# Patient Record
Sex: Male | Born: 2000
Health system: Southern US, Community
[De-identification: ages and names within clinical notes are randomized; demographics above are authoritative.]

## PROBLEM LIST (undated history)

## (undated) DIAGNOSIS — L309 Dermatitis, unspecified: Secondary | ICD-10-CM

---
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
## 2023-03-13 ENCOUNTER — Ambulatory Visit (INDEPENDENT_AMBULATORY_CARE_PROVIDER_SITE_OTHER): Payer: 59

## 2023-03-13 ENCOUNTER — Ambulatory Visit
Admission: RE | Admit: 2023-03-13 | Discharge: 2023-03-13 | Disposition: A | Discharge status: Home / Self Care | Payer: 59 | Source: Ambulatory Visit | Attending: Urgent Care | Admitting: Urgent Care

## 2023-03-13 VITALS — BP 112/75 | HR 75 | Temp 98.7°F | Resp 16

## 2023-03-13 DIAGNOSIS — R194 Change in bowel habit: Secondary | ICD-10-CM | POA: Diagnosis not present

## 2023-03-13 DIAGNOSIS — R197 Diarrhea, unspecified: Secondary | ICD-10-CM

## 2023-03-13 DIAGNOSIS — R198 Other specified symptoms and signs involving the digestive system and abdomen: Secondary | ICD-10-CM

## 2023-03-13 MED ORDER — DICYCLOMINE HCL 20 MG PO TABS: 20.0000 mg | ORAL_TABLET | Freq: Three times a day (TID) | ORAL | 0 refills | Status: AC

## 2023-03-13 NOTE — ED Provider Notes (Signed)
[facility]    CSN: [id number] Arrival date & time: 03/13/23  1731      History   Chief Complaint Chief Complaint  Patient presents with   Abdominal Pain    I have bad abdominal pain.  when I go bathroom it's mostly just mucous. I also have blood. - Entered by patient    HPI Nathaniel Ward is a 22 y.o. male.   22 year old male presents today due to concerns of changes in his bowel movements.  He states over the past 1 month he has been having significant changes from his baseline toileting habits.  At onset of symptoms, he states he was rather constipated.  At that point he was taking Colace to help with his constipation.  Over the past several weeks however he states he has been having diarrhea.  He reports the diarrhea is mixed with mucus, and sometimes he will have mucus without actual stool.  He denies any abdominal pain, but states it just feels "abnormal".  He still has a normal appetite.  Denies any nausea or vomiting.  He reports intermittent distention, but denies any increased flatulence or eructation. He reports the feeling of tenesmus. He had one episode of blood on the toilet paper when wiping this morning. States his anal region feels "raw."    Abdominal Pain   Past Medical History:  Diagnosis Date   Eczema     There are no problems to display for this patient.   History reviewed. No pertinent surgical history.     Home Medications    Prior to Admission medications   Medication Sig Start Date End Date Taking? Authorizing Provider  dicyclomine (BENTYL) 20 MG tablet Take 1 tablet (20 mg total) by mouth 3 (three) times daily before meals. 03/13/23  Yes Evelin Cake L, PA  acetaminophen (TYLENOL) 500 MG tablet Take 500 mg by mouth every 6 (six) hours as needed.    [provider]  albuterol (PROVENTIL HFA;VENTOLIN HFA) 108 (90 BASE) MCG/ACT inhaler Inhale 2 puffs into the lungs every 4 (four) hours as needed for wheezing (cough, shortness of  breath or wheezing.). 12/12/12   Floydene Flock, MD  diphenhydrAMINE (BENADRYL) 25 MG tablet Take 25 mg by mouth at bedtime as needed.    [provider]  hydrocortisone cream 1 % Apply topically 2 (two) times daily.    [provider]    Family History Family History  Problem Relation Age of Onset   Healthy Mother    Healthy Father     Social History Social History   Tobacco Use   Smoking status: Never   Smokeless tobacco: Never  Vaping Use   Vaping Use: Never used  Substance Use Topics   Alcohol use: Yes    Comment: every other weekend     Allergies   Patient has no known allergies.   Review of Systems Review of Systems  Gastrointestinal:  Positive for abdominal pain.  As per HPI   Physical Exam Triage Vital Signs ED Triage Vitals [03/13/23 1801]  Enc Vitals Group     BP 112/75     Pulse Rate 75     Resp 16     Temp 98.7 F (37.1 C)     Temp Source Oral     SpO2 96 %     Weight      Height      Head Circumference      Peak Flow      Pain  Score 0     Pain Loc      Pain Edu?      Excl. in GC?    No data found.  Updated Vital Signs BP 112/75 (BP Location: Left Arm)   Pulse 75   Temp 98.7 F (37.1 C) (Oral)   Resp 16   SpO2 96%   Visual Acuity Right Eye Distance:   Left Eye Distance:   Bilateral Distance:    Right Eye Near:   Left Eye Near:    Bilateral Near:     Physical Exam Vitals and nursing note reviewed. Exam conducted with a chaperone present.  Constitutional:      General: He is not in acute distress.    Appearance: He is well-developed and normal weight. He is not ill-appearing, toxic-appearing or diaphoretic.  HENT:     Head: Normocephalic and atraumatic.     Mouth/Throat:     Mouth: Mucous membranes are moist.     Pharynx: Oropharynx is clear. No pharyngeal swelling or oropharyngeal exudate.  Eyes:     General: No scleral icterus.    Extraocular Movements: Extraocular movements intact.     Pupils:  Pupils are equal, round, and reactive to light.  Cardiovascular:     Rate and Rhythm: Normal rate and regular rhythm.  Pulmonary:     Effort: Pulmonary effort is normal.     Breath sounds: Normal breath sounds.  Abdominal:     General: Abdomen is flat. Bowel sounds are normal. There is no distension.     Palpations: Abdomen is soft. There is no hepatomegaly or splenomegaly.     Tenderness: There is generalized abdominal tenderness. There is no right CVA tenderness, left CVA tenderness, guarding or rebound. Negative signs include Murphy's sign, Rovsing's sign, McBurney's sign, psoas sign and obturator sign.     Hernia: No hernia is present.  Skin:    General: Skin is warm and dry.     Coloration: Skin is not cyanotic, mottled or pale.     Findings: No rash.  Neurological:     General: No focal deficit present.     Mental Status: He is alert and oriented to person, place, and time.      UC Treatments / Results  Labs (all labs ordered are listed, but only abnormal results are displayed) Labs Reviewed  GASTROINTESTINAL PANEL BY PCR, STOOL (REPLACES STOOL CULTURE)  H. PYLORI ANTIGEN, STOOL  COMPREHENSIVE METABOLIC PANEL  CBC WITH DIFFERENTIAL/PLATELET  CEA    EKG   Radiology DG Abd 2 Views  Result Date: 03/13/2023 CLINICAL DATA:  change in bowel movements, tenesmus, constipation/ diarrhea; EXAM: ABDOMEN - 2 VIEW COMPARISON:  None Available. FINDINGS: Upright and supine views of the abdomen obtained. No free intra-abdominal air. No bowel dilatation or evidence of obstruction. There is a moderate volume of stool in the ascending, distal transverse and descending colon. Small volume of stool in the sigmoid colon. No abnormal rectal distention. No bowel air-fluid levels. No radiopaque foreign body or soft tissue calcifications. No concerning intraabdominal mass effect. No osseous abnormalities. The lung bases are clear. Mild overlying artifact from clothing. IMPRESSION: 1. Normal bowel  gas pattern. 2. Moderate colonic stool burden. No obstruction or fecal impaction. Electronically Signed   By: Narda Rutherford M.D.   On: 03/13/2023 18:57    Procedures Procedures (including critical care time)  Medications Ordered in UC Medications - No data to display  Initial Impression / Assessment and Plan / UC Course  I have  reviewed the triage vital signs and the nursing notes.  Pertinent labs & imaging results that were available during my care of the patient were reviewed by me and considered in my medical decision making (see chart for details).     Diarrhea -patient does have a moderate amount of retained stool as noted on his x-ray, but is complaining primarily of diarrhea.  I question if this is more of an IBS-like picture.  He is not actually having abdominal pain, I do not suspect this to be a colitis.  We will do a trial of dicyclomine to help with his current symptoms.  I also will test his stool for O&P, culture and sensitivity, and H. pylori antigen. Tenesmus -given patient's current symptoms, I feel that he warrants further evaluation with gastroenterology.  Pt to call GI tomorrow to schedule a further workup Recent change in BMs -CMP, CBC, CEA obtained today.  Patient has a family history of colon cancer apparently.  Patient's vital signs are stable, overall exam today benign.  Workup as above.   Final Clinical Impressions(s) / UC Diagnoses   Final diagnoses:  Diarrhea, unspecified type  Tenesmus (rectal)  Recent change in frequency of bowel movements     Discharge Instructions      Your x-ray does show a moderate amount of stool however no impaction. Please complete the stool sample at your earliest convenience and bring back to our office to send out. We drew labs to further assess the cause of your symptoms. Please start taking the medication called in today.  Is an antispasmodic for the GI tract. Please also call [facility], their phone number and address is listed on this form.  They will perform a more in-depth and further workup.  If you develop severe abdominal pain or continued bleeding, please head to the ER.     ED Prescriptions     Medication Sig Dispense Auth. Provider   dicyclomine (BENTYL) 20 MG tablet Take 1 tablet (20 mg total) by mouth 3 (three) times daily before meals. 20 tablet Srihaan Mastrangelo L, [place]      PDMP not reviewed this encounter.   Maretta Bees, [place] 03/13/23 1930

## 2023-03-13 NOTE — Discharge Instructions (Addendum)
Your x-ray does show a moderate amount of stool however no impaction. Please complete the stool sample at your earliest convenience and bring back to our office to send out. We drew labs to further assess the cause of your symptoms. Please start taking the medication called in today.  Is an antispasmodic for the GI tract. Please also call [facility], their phone number and address is listed on this form.  They will perform a more in-depth and further workup.  If you develop severe abdominal pain or continued bleeding, please head to the ER.

## 2023-03-13 NOTE — ED Triage Notes (Signed)
Nausea with eating with diarrhea following eating. This past Friday he noticed blood on the toilet paper, hasn't seen any since. States he does have generalized abdominal cramping. Concerned d/t a large family hx of colon cancer. States these symptoms have been on-going for a month. Does not have a primary care doctor and says he has not history of similar experiences. Denies any fevers, changes to urinary habits, recent course of antibiotics.

## 2023-03-13 NOTE — ED Notes (Signed)
Patient could not obtain sample while in office, provider notified

## 2023-03-16 LAB — CBC WITH DIFFERENTIAL/PLATELET
Basophils Absolute: 0 10*3/uL (ref 0.0–0.2)
Basos: 1 %
EOS (ABSOLUTE): 0.2 10*3/uL (ref 0.0–0.4)
Eos: 4 %
Hematocrit: 41 % (ref 37.5–51.0)
Hemoglobin: 14.2 g/dL (ref 13.0–17.7)
Immature Grans (Abs): 0 10*3/uL (ref 0.0–0.1)
Immature Granulocytes: 0 %
Lymphocytes Absolute: 1.8 10*3/uL (ref 0.7–3.1)
Lymphs: 28 %
MCH: 31.1 pg (ref 26.6–33.0)
MCHC: 34.6 g/dL (ref 31.5–35.7)
MCV: 90 fL (ref 79–97)
Monocytes Absolute: 0.6 10*3/uL (ref 0.1–0.9)
Monocytes: 9 %
Neutrophils Absolute: 3.6 10*3/uL (ref 1.4–7.0)
Neutrophils: 58 %
Platelets: 295 10*3/uL (ref 150–450)
RBC: 4.56 x10E6/uL (ref 4.14–5.80)
RDW: 11.8 % (ref 11.6–15.4)
WBC: 6.2 10*3/uL (ref 3.4–10.8)

## 2023-03-16 LAB — COMPREHENSIVE METABOLIC PANEL
ALT: 24 IU/L (ref 0–44)
AST: 21 IU/L (ref 0–40)
Albumin/Globulin Ratio: 2 (ref 1.2–2.2)
Albumin: 4.8 g/dL (ref 4.3–5.2)
Alkaline Phosphatase: 88 IU/L (ref 44–121)
BUN/Creatinine Ratio: 18 (ref 9–20)
BUN: 16 mg/dL (ref 6–20)
Bilirubin Total: <0.2 mg/dL (ref 0.0–1.2)
CO2: 25 mmol/L (ref 20–29)
Calcium: 9.8 mg/dL (ref 8.7–10.2)
Chloride: 101 mmol/L (ref 96–106)
Creatinine, Ser: 0.89 mg/dL (ref 0.76–1.27)
Globulin, Total: 2.4 g/dL (ref 1.5–4.5)
Glucose: 92 mg/dL (ref 70–99)
Potassium: 4.3 mmol/L (ref 3.5–5.2)
Sodium: 141 mmol/L (ref 134–144)
Total Protein: 7.2 g/dL (ref 6.0–8.5)
eGFR: 125 mL/min/{1.73_m2} (ref >59)

## 2023-03-16 LAB — CEA: CEA: 0.7 ng/mL (ref 0.0–4.7)

## 2024-10-26 ENCOUNTER — Ambulatory Visit
Admission: EM | Admit: 2024-10-26 | Discharge: 2024-10-26 | Disposition: A | Discharge status: Home / Self Care | Attending: Family Medicine | Admitting: Family Medicine

## 2024-10-26 ENCOUNTER — Ambulatory Visit

## 2024-10-26 ENCOUNTER — Encounter: Payer: Self-pay | Admitting: *Deleted

## 2024-10-26 DIAGNOSIS — M25572 Pain in left ankle and joints of left foot: Secondary | ICD-10-CM | POA: Diagnosis not present

## 2024-10-26 DIAGNOSIS — S93492S Sprain of other ligament of left ankle, sequela: Secondary | ICD-10-CM

## 2024-10-26 MED ORDER — CELECOXIB 200 MG PO CAPS: 200.0000 mg | ORAL_CAPSULE | Freq: Every day | ORAL | 0 refills | Status: AC

## 2024-10-26 NOTE — ED Provider Notes (Signed)
 " [name]    CSN: [id number] Arrival date & time: 10/26/24  1027      History   Chief Complaint Chief Complaint  Patient presents with   Ankle Pain    HPI Nathaniel Ward is a 24 y.o. male.   HPI pleasant 24 year old male presents with left ankle pain and swelling after doing 15 mile ultra run 3 weeks ago.  Patient reports took 1 way off and now is running 20 miles per week since.  Reports running 10 miles last night.  Patient is accompanied by his wife this morning.  Past Medical History:  Diagnosis Date   Eczema     There are no active problems to display for this patient.   History reviewed. No pertinent surgical history.     Home Medications    Prior to Admission medications  Medication Sig Start Date End Date Taking? Authorizing Provider  celecoxib  (CELEBREX ) 200 MG capsule Take 1 capsule (200 mg total) by mouth daily for 15 days. 10/26/24 11/10/24 Yes Teddy Sharper, FNP  dicyclomine  (BENTYL ) 20 MG tablet Take 1 tablet (20 mg total) by mouth 3 (three) times daily before meals. 03/13/23   Lowella Benton CROME, PA    Family History Family History  Problem Relation Age of Onset   Healthy Mother    Healthy Father     Social History Social History[1]   Allergies   Patient has no known allergies.   Review of Systems Review of Systems  Musculoskeletal:        Left ankle pain x 3 weeks secondary to endurance running.  All other systems reviewed and are negative.    Physical Exam Triage Vital Signs ED Triage Vitals  Encounter Vitals Group     BP 10/26/24 1109 117/73     Girls Systolic BP Percentile --      Girls Diastolic BP Percentile --      Boys Systolic BP Percentile --      Boys Diastolic BP Percentile --      Pulse Rate 10/26/24 1109 77     Resp 10/26/24 1109 16     Temp 10/26/24 1109 97.8 F (36.6 C)     Temp Source 10/26/24 1109 Oral     SpO2 10/26/24 1109 96 %     Weight 10/26/24 1107 169 lb 8 oz (76.9 kg)     Height --       Head Circumference --      Peak Flow --      Pain Score 10/26/24 1107 7     Pain Loc --      Pain Education --      Exclude from Growth Chart --    No data found.  Updated Vital Signs BP 117/73 (BP Location: Right Arm)   Pulse 77   Temp 97.8 F (36.6 C) (Oral)   Resp 16   Wt 169 lb 8 oz (76.9 kg)   SpO2 96%   BMI 26.55 kg/m    Physical Exam Vitals and nursing note reviewed.  Constitutional:      Appearance: Normal appearance. He is normal weight.  HENT:     Head: Normocephalic and atraumatic.     Mouth/Throat:     Mouth: Mucous membranes are moist.     Pharynx: Oropharynx is clear.  Eyes:     Extraocular Movements: Extraocular movements intact.     Conjunctiva/sclera: Conjunctivae normal.     Pupils: Pupils are equal, round, and reactive to light.  Cardiovascular:     Rate and Rhythm: Normal rate and regular rhythm.     Heart sounds: Normal heart sounds.  Pulmonary:     Effort: Pulmonary effort is normal.     Breath sounds: Normal breath sounds. No wheezing, rhonchi or rales.  Musculoskeletal:        General: Normal range of motion.  Skin:    General: Skin is warm and dry.  Neurological:     Mental Status: He is alert.      UC Treatments / Results  Labs (all labs ordered are listed, but only abnormal results are displayed) Labs Reviewed - No data to display  EKG   Radiology DG Ankle Complete Left Result Date: 10/26/2024 EXAM: 3 OR MORE VIEW(S) XRAY OF THE LEFT ANKLE 10/26/2024 11:29:05 AM CLINICAL HISTORY: Left ankle pain x 3 weeks COMPARISON: None available. FINDINGS: BONES AND JOINTS: No acute fracture. No malalignment. SOFT TISSUES: Unremarkable. IMPRESSION: 1. No significant abnormality. Electronically signed by: Evalene Coho MD 10/26/2024 11:50 AM EST RP Workstation: [id number]    Procedures Procedures (including critical care time)  Medications Ordered in UC Medications - No data to display  Initial Impression / Assessment and Plan / UC  Course  I have reviewed the triage vital signs and the nursing notes.  Pertinent labs & imaging results that were available during my care of the patient were reviewed by me and considered in my medical decision making (see chart for details).     MDM: 1.  Acute left ankle pain-DG left ankle complete results revealed above, patient advised.  Rx'd Celebrex  200 mg capsule: Take 1 capsule daily x 15 days; 2.  Sprain of other ligament of left ankle, sequela. Advised patient of left ankle x-ray results with hardcopy.  Advised patient may RICE affected area of left ankle for 30 minutes 3 times daily for the next 3 days.  Encouraged patient to avoid running for the next 7 to 10 days.  Advised patient may swim for increased cardiovascular endurance.  Advised to take medication as directed with food to completion.  Encouraged increase daily water intake to 64 ounces per day while taking this medication.  Advised if symptoms worsen and/or unresolved please follow-up with your PCP, [facility], or for further evaluation. Patient discharged home, hemodynamically stable. Final Clinical Impressions(s) / UC Diagnoses   Final diagnoses:  Acute left ankle pain  Sprain of other ligament of left ankle, sequela     Discharge Instructions      Advised patient of left ankle x-ray results with hardcopy.  Advised patient may RICE affected area of left ankle for 30 minutes 3 times daily for the next 3 days.  Encouraged patient to avoid running for the next 7 to 10 days.  Advised patient may swim for increased cardiovascular endurance.  Advised to take medication as directed with food to completion.  Encouraged increase daily water intake to 64 ounces per day while taking this medication.  Advised if symptoms worsen and/or unresolved please follow-up with your PCP, [facility], or for further evaluation.     ED Prescriptions     Medication Sig Dispense Auth. Provider   celecoxib  (CELEBREX )  200 MG capsule Take 1 capsule (200 mg total) by mouth daily for 15 days. 15 capsule Eragon Hammond, FNP      I have reviewed the PDMP during this encounter.    [1]  Social History Tobacco Use   Smoking status: Never   Smokeless tobacco: Never  Vaping Use   Vaping status: Never Used  Substance Use Topics   Alcohol use: Yes    Comment: every other weekend     Teddy Sharper, FNP 10/26/24 1229  "

## 2024-10-26 NOTE — ED Triage Notes (Signed)
 Patient states left ankle pain, some swelling since doing a 50 mile ultra run 3 weeks ago, took 1 week off and has been running 20 mile weeks since, 10 mile run last night.

## 2024-10-26 NOTE — Discharge Instructions (Addendum)
 Advised patient of left ankle x-ray results with hardcopy.  Advised patient may RICE affected area of left ankle for 30 minutes 3 times daily for the next 3 days.  Encouraged patient to avoid running for the next 7 to 10 days.  Advised patient may swim for increased cardiovascular endurance.  Advised to take medication as directed with food to completion.  Encouraged increase daily water intake to 64 ounces per day while taking this medication.  Advised if symptoms worsen and/or unresolved please follow-up with your PCP, [facility], or for further evaluation.
